# Patient Record
Sex: Female | Born: 1955 | Race: Black or African American | Hispanic: No | Marital: Married | Smoking: Never smoker
Health system: Southern US, Community
[De-identification: ages and names within clinical notes are randomized; demographics above are authoritative.]

## PROBLEM LIST (undated history)

## (undated) DIAGNOSIS — K219 Gastro-esophageal reflux disease without esophagitis: Secondary | ICD-10-CM

## (undated) DIAGNOSIS — I8390 Asymptomatic varicose veins of unspecified lower extremity: Secondary | ICD-10-CM

## (undated) DIAGNOSIS — T7840XA Allergy, unspecified, initial encounter: Secondary | ICD-10-CM

## (undated) DIAGNOSIS — F32A Depression, unspecified: Secondary | ICD-10-CM

## (undated) DIAGNOSIS — F329 Major depressive disorder, single episode, unspecified: Secondary | ICD-10-CM

## (undated) DIAGNOSIS — J302 Other seasonal allergic rhinitis: Secondary | ICD-10-CM

## (undated) HISTORY — PX: UPPER GI ENDOSCOPY: SHX6162

## (undated) HISTORY — DX: Other seasonal allergic rhinitis: J30.2

## (undated) HISTORY — DX: Asymptomatic varicose veins of unspecified lower extremity: I83.90

## (undated) HISTORY — PX: WISDOM TOOTH EXTRACTION: SHX21

## (undated) HISTORY — DX: Major depressive disorder, single episode, unspecified: F32.9

## (undated) HISTORY — DX: Depression, unspecified: F32.A

## (undated) HISTORY — DX: Allergy, unspecified, initial encounter: T78.40XA

## (undated) HISTORY — DX: Gastro-esophageal reflux disease without esophagitis: K21.9

---

## 2001-06-21 ENCOUNTER — Ambulatory Visit: Admission: RE | Admit: 2001-06-21 | Discharge: 2001-06-21 | Payer: Self-pay | Admitting: *Deleted

## 2001-06-21 ENCOUNTER — Encounter: Payer: Self-pay | Admitting: Family Medicine

## 2002-02-27 ENCOUNTER — Encounter: Payer: Self-pay | Admitting: Family Medicine

## 2002-02-27 ENCOUNTER — Encounter: Admission: RE | Admit: 2002-02-27 | Discharge: 2002-02-27 | Payer: Self-pay | Admitting: Family Medicine

## 2003-05-19 ENCOUNTER — Encounter: Payer: Self-pay | Admitting: Internal Medicine

## 2003-05-19 ENCOUNTER — Encounter: Admission: RE | Admit: 2003-05-19 | Discharge: 2003-05-19 | Payer: Self-pay | Admitting: Internal Medicine

## 2003-06-04 ENCOUNTER — Encounter: Admission: RE | Admit: 2003-06-04 | Discharge: 2003-06-04 | Payer: Self-pay | Admitting: Internal Medicine

## 2003-06-04 ENCOUNTER — Encounter: Payer: Self-pay | Admitting: Internal Medicine

## 2003-07-05 ENCOUNTER — Encounter: Admission: RE | Admit: 2003-07-05 | Discharge: 2003-07-05 | Payer: Self-pay | Admitting: Internal Medicine

## 2003-07-05 ENCOUNTER — Encounter: Payer: Self-pay | Admitting: Internal Medicine

## 2003-07-26 ENCOUNTER — Encounter: Payer: Self-pay | Admitting: Pain Medicine

## 2003-07-26 ENCOUNTER — Encounter: Admission: RE | Admit: 2003-07-26 | Discharge: 2003-07-26 | Payer: Self-pay | Admitting: Pain Medicine

## 2003-12-13 HISTORY — PX: BUNIONECTOMY: SHX129

## 2004-07-15 ENCOUNTER — Encounter: Admission: RE | Admit: 2004-07-15 | Discharge: 2004-07-15 | Payer: Self-pay | Admitting: Internal Medicine

## 2004-08-02 ENCOUNTER — Encounter: Admission: RE | Admit: 2004-08-02 | Discharge: 2004-08-02 | Payer: Self-pay | Admitting: Internal Medicine

## 2004-08-12 ENCOUNTER — Ambulatory Visit (HOSPITAL_COMMUNITY): Admission: RE | Admit: 2004-08-12 | Discharge: 2004-08-12 | Payer: Self-pay | Admitting: Family Medicine

## 2004-09-01 ENCOUNTER — Ambulatory Visit (HOSPITAL_COMMUNITY): Admission: RE | Admit: 2004-09-01 | Discharge: 2004-09-01 | Payer: Self-pay | Admitting: Family Medicine

## 2005-07-12 ENCOUNTER — Encounter: Admission: RE | Admit: 2005-07-12 | Discharge: 2005-07-12 | Payer: Self-pay | Admitting: Internal Medicine

## 2005-11-10 ENCOUNTER — Ambulatory Visit (HOSPITAL_COMMUNITY): Admission: RE | Admit: 2005-11-10 | Discharge: 2005-11-10 | Payer: Self-pay | Admitting: Orthopedic Surgery

## 2005-11-10 ENCOUNTER — Ambulatory Visit (HOSPITAL_BASED_OUTPATIENT_CLINIC_OR_DEPARTMENT_OTHER): Admission: RE | Admit: 2005-11-10 | Discharge: 2005-11-10 | Payer: Self-pay | Admitting: Orthopedic Surgery

## 2006-09-05 ENCOUNTER — Encounter: Admission: RE | Admit: 2006-09-05 | Discharge: 2006-09-05 | Payer: Self-pay | Admitting: Family Medicine

## 2008-01-10 ENCOUNTER — Encounter: Admission: RE | Admit: 2008-01-10 | Discharge: 2008-01-10 | Payer: Self-pay | Admitting: Family Medicine

## 2009-02-11 ENCOUNTER — Encounter: Payer: Self-pay | Admitting: Gastroenterology

## 2009-02-25 ENCOUNTER — Encounter: Payer: Self-pay | Admitting: Gastroenterology

## 2009-03-04 ENCOUNTER — Encounter: Admission: RE | Admit: 2009-03-04 | Discharge: 2009-03-04 | Payer: Self-pay | Admitting: Internal Medicine

## 2009-04-28 ENCOUNTER — Ambulatory Visit: Payer: Self-pay | Admitting: Gastroenterology

## 2009-04-28 DIAGNOSIS — D509 Iron deficiency anemia, unspecified: Secondary | ICD-10-CM

## 2009-04-28 DIAGNOSIS — K219 Gastro-esophageal reflux disease without esophagitis: Secondary | ICD-10-CM | POA: Insufficient documentation

## 2009-05-08 ENCOUNTER — Ambulatory Visit: Payer: Self-pay | Admitting: Gastroenterology

## 2009-05-08 LAB — CONVERTED CEMR LAB: Fecal Occult Bld: NEGATIVE

## 2010-04-28 ENCOUNTER — Encounter: Admission: RE | Admit: 2010-04-28 | Discharge: 2010-04-28 | Payer: Self-pay | Admitting: Internal Medicine

## 2011-04-29 NOTE — Op Note (Signed)
NAME:  Valerie Vega, Valerie Vega                 ACCOUNT NO.:  [id number]   MEDICAL RECORD NO.:  [id number]          PATIENT TYPE:  AMB   LOCATION:  [facility]                         FACILITY:  [facility]   PHYSICIAN:  Marlowe Kays, M.D.  DATE OF BIRTH:  June 13, 1956   DATE OF PROCEDURE:  11/10/2005  DATE OF DISCHARGE:                                 OPERATIVE REPORT   PREOPERATIVE DIAGNOSES:  Painful bunion, right foot.   POSTOPERATIVE DIAGNOSES:  Painful bunion, right foot.   OPERATION:  Simple bunionectomy, right foot.   SURGEON:  Marlowe Kays, MD.   ASSISTANT:  Nurse.   ANESTHESIA:  General.   PATHOLOGY AND JUSTIFICATION FOR PROCEDURE:  She had a painful bunion in her  right foot with a 10 degree first and second metatarsal angle and I felt  that either McBride or simple bunionectomy would suffice and she did have  some hallux valgus deformity as well.   DESCRIPTION OF PROCEDURE:  Satisfactory general anesthesia, pneumatic  tourniquet, right lower extremity was esmarched out sterilely and prepped  from mid calf to toes with DuraPrep, draped in a sterile field. A dorsal  medial incision was made protecting the overlying dorsal sensory nerve, the  capsule was identified and opened with a flap based distally. A large bunion  was noted. She also had some dorsal osteophytes which were fairly prominent  and a good bit of joint fluid from irritation. With a small rongeur, I  resected the spurs off the dorsum of the first metatarsal head with the  joint surfaces looking good and then used an osteotome and rongeur to remove  the large bunion in a smoothed off straight fashion. I then was able to  bring the great toe in a corrected position and with proximal traction on  the capsular flap, hold it there. I trimmed off a bit of the proximal  portion of the flap so as to remove redundant capsule. After irrigating the  wound well with sterile saline and injecting the wound with 0.5% plain  Marcaine, I  closed it. With a great toe in neutral, I placed multiple  capsular stitches of #0 Vicryl followed by multiple interrupted 4-0 nylon  mattress sutures in the skin and subcutaneous tissue. Betadine Adaptic dry  sterile dressing were applied, tourniquet was released and at the time of  this dictation she was on her way to the recovery room in satisfactory  condition with no known complications.           ______________________________  Marlowe Kays, M.D.     JA/MEDQ  D:  11/10/2005  T:  11/11/2005  Job:  [id number]

## 2011-08-05 ENCOUNTER — Other Ambulatory Visit: Payer: Self-pay | Admitting: Internal Medicine

## 2011-08-05 DIAGNOSIS — Z1231 Encounter for screening mammogram for malignant neoplasm of breast: Secondary | ICD-10-CM

## 2011-08-23 ENCOUNTER — Ambulatory Visit
Admission: RE | Admit: 2011-08-23 | Discharge: 2011-08-23 | Disposition: A | Discharge status: Home / Self Care | Source: Ambulatory Visit | Attending: Internal Medicine | Admitting: Internal Medicine

## 2011-08-23 DIAGNOSIS — Z1231 Encounter for screening mammogram for malignant neoplasm of breast: Secondary | ICD-10-CM

## 2012-11-30 ENCOUNTER — Other Ambulatory Visit: Payer: Self-pay | Admitting: Obstetrics & Gynecology

## 2012-11-30 DIAGNOSIS — Z1231 Encounter for screening mammogram for malignant neoplasm of breast: Secondary | ICD-10-CM

## 2012-12-24 ENCOUNTER — Ambulatory Visit
Admission: RE | Admit: 2012-12-24 | Discharge: 2012-12-24 | Disposition: A | Discharge status: Home / Self Care | Source: Ambulatory Visit | Attending: Obstetrics & Gynecology | Admitting: Obstetrics & Gynecology

## 2012-12-24 DIAGNOSIS — Z1231 Encounter for screening mammogram for malignant neoplasm of breast: Secondary | ICD-10-CM

## 2014-05-08 ENCOUNTER — Other Ambulatory Visit: Payer: Self-pay | Admitting: Internal Medicine

## 2014-05-08 DIAGNOSIS — Z1231 Encounter for screening mammogram for malignant neoplasm of breast: Secondary | ICD-10-CM

## 2014-05-12 ENCOUNTER — Encounter: Payer: Self-pay | Admitting: Obstetrics & Gynecology

## 2014-05-13 ENCOUNTER — Ambulatory Visit: Payer: Self-pay | Admitting: Obstetrics & Gynecology

## 2014-05-13 ENCOUNTER — Encounter: Payer: Self-pay | Admitting: Obstetrics & Gynecology

## 2014-05-19 ENCOUNTER — Telehealth: Payer: Self-pay | Admitting: Obstetrics & Gynecology

## 2014-05-19 NOTE — Telephone Encounter (Signed)
Patient called to reschedule a missed appointment [facility]) for her AEX on 05/13/14 with Dr. Hyacinth Meeker. She requested the missed appointment fee be waived due to her doctor's office, [facility], made the appointment and did not notify her of it. I called Dr. Vicente Males office and spoke with Misty Stanley to confirm they did schedule the appointment. Per Misty Stanley, they did schedule it for her and did notify her of the appointment on 05/08/14, the same day it was made. Please advise?

## 2014-05-20 ENCOUNTER — Ambulatory Visit

## 2014-05-21 ENCOUNTER — Ambulatory Visit
Admission: RE | Admit: 2014-05-21 | Discharge: 2014-05-21 | Disposition: A | Discharge status: Home / Self Care | Source: Ambulatory Visit | Attending: Internal Medicine | Admitting: Internal Medicine

## 2014-05-21 DIAGNOSIS — Z1231 Encounter for screening mammogram for malignant neoplasm of breast: Secondary | ICD-10-CM

## 2014-05-22 NOTE — Telephone Encounter (Signed)
DNKA FEE WAIVED THIS TIME AS MISCOMMUNICATION BETWEEN PTS PCP AND PT LEFT MESSAGE FOR PT TO RETURN MY CALL

## 2014-06-17 ENCOUNTER — Encounter: Payer: Self-pay | Admitting: Nurse Practitioner

## 2014-06-17 ENCOUNTER — Ambulatory Visit (INDEPENDENT_AMBULATORY_CARE_PROVIDER_SITE_OTHER): Admitting: Nurse Practitioner

## 2014-06-17 VITALS — BP 136/74 | HR 84 | Ht 67.5 in | Wt 165.0 lb

## 2014-06-17 DIAGNOSIS — Z01419 Encounter for gynecological examination (general) (routine) without abnormal findings: Secondary | ICD-10-CM

## 2014-06-17 NOTE — Patient Instructions (Signed)

## 2014-06-17 NOTE — Progress Notes (Signed)
Patient ID: Valerie Vega, female   DOB: 12/11/1956, 58 y.o.   MRN: [id number] 58 y.o. 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  Doing well on Vesicare from PCP.  PCP has given her another med to try but not as helpful.  Patient and husband travel a lot and enjoy each other all the time.  He will be going on part time work next month and she is only doing part time real estate.  They will have more time to travel.  Married this time for 13 years.  Patient's last menstrual period was 12/12/2008.          Sexually active: Yes.    The current method of family planning is post menopausal status.    Exercising: Yes.    Home exercise routine includes walking and gardening. Smoker:  no  Health Maintenance: Pap:  11/24/11, WNL, neg HR HPV MMG:  05/21/14, Bi-Rads 1: negative Colonoscopy:  2007, repeat in 10 years BMD:   05/2011[facility] [place] [facility] TDaP:  09/22/09 Labs: PCP   reports that she has never smoked. She has never used smokeless tobacco. She reports that she drinks alcohol. She reports that she does not use illicit drugs.  Past Medical History  Diagnosis Date  . GERD (gastroesophageal reflux disease)   . Seasonal allergies   . Anemia   . Varicose vein     Past Surgical History  Procedure Laterality Date  . Bunionectomy  2005    right foot    Current Outpatient Prescriptions  Medication Sig Dispense Refill  . ALFALFA PO Take by mouth daily.      . Calcium-Magnesium-Vitamin D (CALCIUM 500 PO) Take by mouth 2 (two) times daily.      . Cholecalciferol (VITAMIN D PO) Take by mouth daily.      . Loratadine-Pseudoephedrine (CLARITIN-D 24 HOUR PO) Take by mouth as needed.      . Multiple Vitamins-Minerals (MULTIVITAMIN PO) Take by mouth daily.      . Naproxen Sodium (ALEVE PO) Take by mouth as needed.      . NON FORMULARY Protein shake daily      . NON FORMULARY Menopause vitamin daily      . pantoprazole (PROTONIX) 20 MG tablet Take 20 mg  by mouth daily.      . solifenacin (VESICARE) 5 MG tablet Take 5 mg by mouth daily.      . Cyanocobalamin (VITAMIN B-12 PO) Take by mouth daily.       No current facility-administered medications for this visit.    Family History  Problem Relation Age of Onset  . Diabetes Maternal Aunt   . Leukemia Brother   . Lung cancer Father 3475    smoker  . Hypertension Mother   . Osteoporosis Mother   . Hypertension Sister   . Varicose Veins Sister     ROS:  Pertinent items are noted in HPI.  Otherwise, a comprehensive ROS was negative.  Exam:   BP 136/74  Pulse 84  Ht 5' 7.5" (1.715 m)  Wt 165 lb (74.844 kg)  BMI 25.45 kg/m2  LMP 12/12/2008 Height: 5' 7.5" (171.5 cm)  Ht Readings from Last 3 Encounters:  06/17/14 5' 7.5" (1.715 m)  04/28/09 5\' 7"  (1.702 m)    General appearance: alert, cooperative and appears stated age Head: Normocephalic, without obvious abnormality, atraumatic Neck: no adenopathy, supple, symmetrical, trachea midline and thyroid normal to inspection and palpation Lungs: clear to  auscultation bilaterally Breasts: normal appearance, no masses or tenderness Heart: regular rate and rhythm Abdomen: soft, non-tender; no masses,  no organomegaly Extremities: extremities normal, atraumatic, no cyanosis or edema Skin: Skin color, texture, turgor normal. No rashes or lesions Lymph nodes: Cervical, supraclavicular, and axillary nodes normal. No abnormal inguinal nodes palpated Neurologic: Grossly normal   Pelvic: External genitalia:  no lesions              Urethra:  normal appearing urethra with no masses, tenderness or lesions              Bartholin's and Skene's: normal                 Vagina: normal appearing vagina with normal color and discharge, no lesions              Cervix: anteverted              Pap taken: No. Bimanual Exam:  Uterus:  normal size, contour, position, consistency, mobility, non-tender              Adnexa: no mass, fullness, tenderness                Rectovaginal: Confirms               Anus:  normal sphincter tone, no lesions  A:  Well Woman with normal exam  Postmenopausal  History of urge incontinence doing well on Vesicare  P:   Reviewed health and wellness pertinent to exam  Pap smear not taken today  Mammogram is due 05/2015  PCP gives Vesicare  Counseled on breast self exam, mammography screening, adequate intake of calcium and vitamin D, diet and exercise, Kegel's exercises return annually or prn  An After Visit Summary was printed and given to the patient.

## 2014-06-18 NOTE — Progress Notes (Signed)
Encounter reviewed by Dr. Brook Silva.  

## 2014-10-13 ENCOUNTER — Encounter: Payer: Self-pay | Admitting: Nurse Practitioner

## 2015-12-23 ENCOUNTER — Other Ambulatory Visit: Payer: Self-pay

## 2015-12-23 DIAGNOSIS — Z1231 Encounter for screening mammogram for malignant neoplasm of breast: Secondary | ICD-10-CM

## 2016-01-08 ENCOUNTER — Ambulatory Visit
Admission: RE | Admit: 2016-01-08 | Discharge: 2016-01-08 | Disposition: A | Discharge status: Home / Self Care | Source: Ambulatory Visit

## 2016-01-08 DIAGNOSIS — Z1231 Encounter for screening mammogram for malignant neoplasm of breast: Secondary | ICD-10-CM

## 2017-09-26 ENCOUNTER — Other Ambulatory Visit: Payer: Self-pay | Admitting: Internal Medicine

## 2017-09-26 DIAGNOSIS — Z1231 Encounter for screening mammogram for malignant neoplasm of breast: Secondary | ICD-10-CM

## 2017-10-03 ENCOUNTER — Ambulatory Visit
Admission: RE | Admit: 2017-10-03 | Discharge: 2017-10-03 | Disposition: A | Discharge status: Home / Self Care | Source: Ambulatory Visit | Attending: Internal Medicine | Admitting: Internal Medicine

## 2017-10-03 DIAGNOSIS — Z1231 Encounter for screening mammogram for malignant neoplasm of breast: Secondary | ICD-10-CM

## 2017-10-24 ENCOUNTER — Encounter: Payer: Self-pay | Admitting: Internal Medicine

## 2017-11-08 ENCOUNTER — Encounter: Payer: Self-pay | Admitting: Gastroenterology

## 2017-12-21 ENCOUNTER — Ambulatory Visit (AMBULATORY_SURGERY_CENTER): Payer: Self-pay | Admitting: *Deleted

## 2017-12-21 ENCOUNTER — Other Ambulatory Visit: Payer: Self-pay

## 2017-12-21 VITALS — Ht 66.0 in | Wt 178.4 lb

## 2017-12-21 DIAGNOSIS — Z1211 Encounter for screening for malignant neoplasm of colon: Secondary | ICD-10-CM

## 2017-12-21 MED ORDER — SUPREP BOWEL PREP KIT 17.5-3.13-1.6 GM/177ML PO SOLN: 1.0000 | Freq: Once | ORAL | 0 refills | Status: AC

## 2017-12-21 NOTE — Progress Notes (Signed)
Patient denies any allergies to egg or soy products. Patient denies complications with anesthesia/sedation.  Patient denies oxygen use at home and denies diet medications. Pamphlet on colonoscopy given to patient. 

## 2018-01-04 ENCOUNTER — Encounter: Admitting: Gastroenterology

## 2018-01-09 ENCOUNTER — Telehealth: Payer: Self-pay | Admitting: Gastroenterology

## 2018-01-09 NOTE — Telephone Encounter (Signed)
Understood. Reschedule OK  - HD

## 2018-01-10 ENCOUNTER — Encounter: Admitting: Gastroenterology

## 2018-01-18 ENCOUNTER — Other Ambulatory Visit: Payer: Self-pay

## 2018-01-18 ENCOUNTER — Encounter: Payer: Self-pay | Admitting: Gastroenterology

## 2018-01-18 ENCOUNTER — Ambulatory Visit (AMBULATORY_SURGERY_CENTER): Admitting: Gastroenterology

## 2018-01-18 VITALS — BP 142/82 | HR 78 | Temp 98.6°F | Resp 13 | Ht 66.0 in | Wt 178.0 lb

## 2018-01-18 DIAGNOSIS — Z1211 Encounter for screening for malignant neoplasm of colon: Secondary | ICD-10-CM

## 2018-01-18 DIAGNOSIS — Z1212 Encounter for screening for malignant neoplasm of rectum: Secondary | ICD-10-CM

## 2018-01-18 MED ORDER — SODIUM CHLORIDE 0.9 % IV SOLN: 500.0000 mL | Freq: Once | INTRAVENOUS | Status: AC

## 2018-01-18 NOTE — Op Note (Signed)
[facility] Patient Name: Valerie Vega Procedure Date: 01/18/2018 10:02 AM MRN: [id number] Endoscopist: Sherilyn CooterHenry L. Myrtie Neitheranis , MD Age: [age] Referring MD:  Date of Birth: 08/27/1956 Gender: Female Account #: [id number] Procedure:                Colonoscopy Indications:              Screening for colorectal malignant neoplasm                            (patient reported a normal colonoscopy > 10 years                            prior) Medicines:                Monitored Anesthesia Care Procedure:                Pre-Anesthesia Assessment:                           - Prior to the procedure, a History and Physical                            was performed, and patient medications and                            allergies were reviewed. The patient's tolerance of                            previous anesthesia was also reviewed. The risks                            and benefits of the procedure and the sedation                            options and risks were discussed with the patient.                            All questions were answered, and informed consent                            was obtained. Prior Anticoagulants: The patient has                            taken no previous anticoagulant or antiplatelet                            agents. ASA Grade Assessment: II - A patient with                            mild systemic disease. After reviewing the risks                            and benefits, the patient was deemed in  satisfactory condition to undergo the procedure.                           After obtaining informed consent, the colonoscope                            was passed under direct vision. Throughout the                            procedure, the patient's blood pressure, pulse, and                            oxygen saturations were monitored continuously. The                            Colonoscope was introduced through the anus and                 advanced to the the cecum, identified by                            appendiceal orifice and ileocecal valve. The                            colonoscopy was performed without difficulty. The                            patient tolerated the procedure well. The quality                            of the bowel preparation was good. The ileocecal                            valve, appendiceal orifice, and rectum were                            photographed. The quality of the bowel preparation                            was evaluated using the BBPS [facility] Bowel                            Preparation Scale) with scores of: Right Colon = 2,                            Transverse Colon = 2 and Left Colon = 2. The total                            BBPS score equals 6. Scope In: 10:19:37 AM Scope Out: 10:36:29 AM Scope Withdrawal Time: 0 hours 12 minutes 23 seconds  Total Procedure Duration: 0 hours 16 minutes 52 seconds  Findings:                 The perianal and digital rectal examinations were  normal.                           The entire examined colon appeared normal on direct                            and retroflexion views. Complications:            No immediate complications. Estimated Blood Loss:     Estimated blood loss: none. Impression:               - The entire examined colon is normal on direct and                            retroflexion views.                           - No specimens collected. Recommendation:           - Patient has a contact number available for                            emergencies. The signs and symptoms of potential                            delayed complications were discussed with the                            patient. Return to normal activities tomorrow.                            Written discharge instructions were provided to the                            patient.                           - Resume previous  diet.                           - Continue present medications.                           - Repeat colonoscopy in 10 years for screening                            purposes. Henry L. Myrtie Neither, MD 01/18/2018 10:39:49 AM This report has been signed electronically.

## 2018-01-18 NOTE — Patient Instructions (Signed)
YOU HAD AN ENDOSCOPIC PROCEDURE TODAY AT THE [facility]:   Refer to the procedure report that was given to you for any specific questions about what was found during the examination.  If the procedure report does not answer your questions, please call your gastroenterologist to clarify.  If you requested that your care partner not be given the details of your procedure findings, then the procedure report has been included in a sealed envelope for you to review at your convenience later.  YOU SHOULD EXPECT: Some feelings of bloating in the abdomen. Passage of more gas than usual.  Walking can help get rid of the air that was put into your GI tract during the procedure and reduce the bloating. If you had a lower endoscopy (such as a colonoscopy or flexible sigmoidoscopy) you may notice spotting of blood in your stool or on the toilet paper. If you underwent a bowel prep for your procedure, you may not have a normal bowel movement for a few days.  Please Note:  You might notice some irritation and congestion in your nose or some drainage.  This is from the oxygen used during your procedure.  There is no need for concern and it should clear up in a day or so.  SYMPTOMS TO REPORT IMMEDIATELY:   Following lower endoscopy (colonoscopy or flexible sigmoidoscopy):  Excessive amounts of blood in the stool  Significant tenderness or worsening of abdominal pains  Swelling of the abdomen that is new, acute  Fever of 100F or higher   For urgent or emergent issues, a gastroenterologist can be reached at any hour by calling [phone].   DIET:  We do recommend a small meal at first, but then you may proceed to your regular diet.  Drink plenty of fluids but you should avoid alcoholic beverages for 24 hours.  ACTIVITY:  You should plan to take it easy for the rest of today and you should NOT DRIVE or use heavy machinery until tomorrow (because of the sedation medicines used during the test).     FOLLOW UP: Our staff will call the number listed on your records the next business day following your procedure to check on you and address any questions or concerns that you may have regarding the information given to you following your procedure. If we do not reach you, we will leave a message.  However, if you are feeling well and you are not experiencing any problems, there is no need to return our call.  We will assume that you have returned to your regular daily activities without incident.  If any biopsies were taken you will be contacted by phone or by letter within the next 1-3 weeks.  Please call us at [phone] if you have not heard about the biopsies in 3 weeks.    SIGNATURES/CONFIDENTIALITY: You and/or your care partner have signed paperwork which will be entered into your electronic medical record.  These signatures attest to the fact that that the information above on your After Visit Summary has been reviewed and is understood.  Full responsibility of the confidentiality of this discharge information lies with you and/or your care-partner.  Recall colonoscopy is in 10 years.  We will send you a leytter.

## 2018-01-18 NOTE — Progress Notes (Signed)
Report given to PACU, vss 

## 2018-01-19 ENCOUNTER — Telehealth: Payer: Self-pay

## 2018-01-19 NOTE — Telephone Encounter (Signed)
  Follow up Call-  Call back number 01/18/2018  Post procedure Call Back phone  # [phone]  Permission to leave phone message Yes  Some recent data might be hidden     Patient questions:  Do you have a fever, pain , or abdominal swelling? No. Pain Score  0 *  Have you tolerated food without any problems? Yes.    Have you been able to return to your normal activities? Yes.    Do you have any questions about your discharge instructions: Diet   No. Medications  No. Follow up visit  No.  Do you have questions or concerns about your Care? No.  Actions: * If pain score is 4 or above: No action needed, pain <4.

## 2018-11-16 ENCOUNTER — Other Ambulatory Visit: Payer: Self-pay | Admitting: Internal Medicine

## 2018-11-16 DIAGNOSIS — Z1231 Encounter for screening mammogram for malignant neoplasm of breast: Secondary | ICD-10-CM

## 2018-12-25 ENCOUNTER — Ambulatory Visit
Admission: RE | Admit: 2018-12-25 | Discharge: 2018-12-25 | Disposition: A | Discharge status: Home / Self Care | Source: Ambulatory Visit | Attending: Internal Medicine | Admitting: Internal Medicine

## 2018-12-25 DIAGNOSIS — Z1231 Encounter for screening mammogram for malignant neoplasm of breast: Secondary | ICD-10-CM

## 2020-11-03 ENCOUNTER — Other Ambulatory Visit: Payer: Self-pay | Admitting: Internal Medicine

## 2020-11-03 DIAGNOSIS — Z1231 Encounter for screening mammogram for malignant neoplasm of breast: Secondary | ICD-10-CM

## 2021-11-24 ENCOUNTER — Other Ambulatory Visit: Payer: Self-pay | Admitting: Internal Medicine

## 2021-11-24 DIAGNOSIS — Z0000 Encounter for general adult medical examination without abnormal findings: Secondary | ICD-10-CM

## 2022-01-19 ENCOUNTER — Ambulatory Visit
Admission: RE | Admit: 2022-01-19 | Discharge: 2022-01-19 | Disposition: A | Discharge status: Home / Self Care | Payer: Medicare Other | Source: Ambulatory Visit | Attending: Internal Medicine | Admitting: Internal Medicine

## 2022-01-19 DIAGNOSIS — Z0000 Encounter for general adult medical examination without abnormal findings: Secondary | ICD-10-CM

## 2023-01-02 ENCOUNTER — Other Ambulatory Visit: Payer: Self-pay | Admitting: Internal Medicine

## 2023-01-02 DIAGNOSIS — Z1231 Encounter for screening mammogram for malignant neoplasm of breast: Secondary | ICD-10-CM

## 2023-02-22 ENCOUNTER — Ambulatory Visit
Admission: RE | Admit: 2023-02-22 | Discharge: 2023-02-22 | Disposition: A | Discharge status: Home / Self Care | Payer: Medicare Other | Source: Ambulatory Visit | Attending: Internal Medicine | Admitting: Internal Medicine

## 2023-02-22 DIAGNOSIS — Z1231 Encounter for screening mammogram for malignant neoplasm of breast: Secondary | ICD-10-CM

## 2023-08-07 IMAGING — MG MM DIGITAL SCREENING BILAT W/ TOMO AND CAD
8 series · 8 of 24 positions shown · non-contrast
Comparison: Previous exam(s).

ACR Breast Density Category a: The breast tissue is almost entirely
fatty.

CLINICAL DATA: Screening.

EXAM:
DIGITAL SCREENING BILATERAL MAMMOGRAM WITH TOMOSYNTHESIS AND CAD
TECHNIQUE: Bilateral screening digital craniocaudal and mediolateral oblique
mammograms were obtained. Bilateral screening digital breast
tomosynthesis was performed. The images were evaluated with
computer-aided detection.

[R MLO synth-2D]
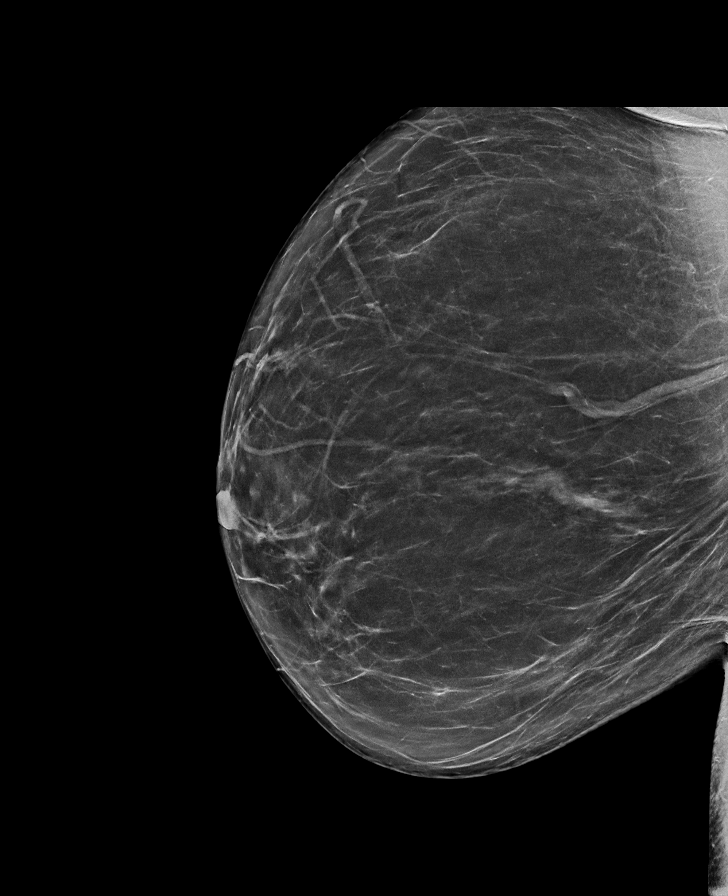

[L MLO synth-2D]
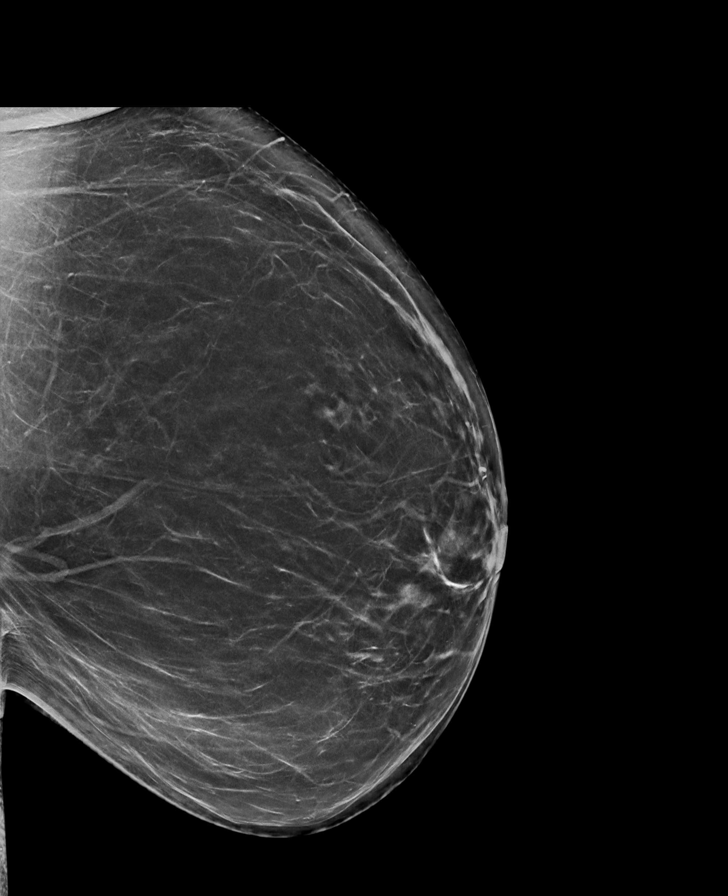

[L CC synth-2D]
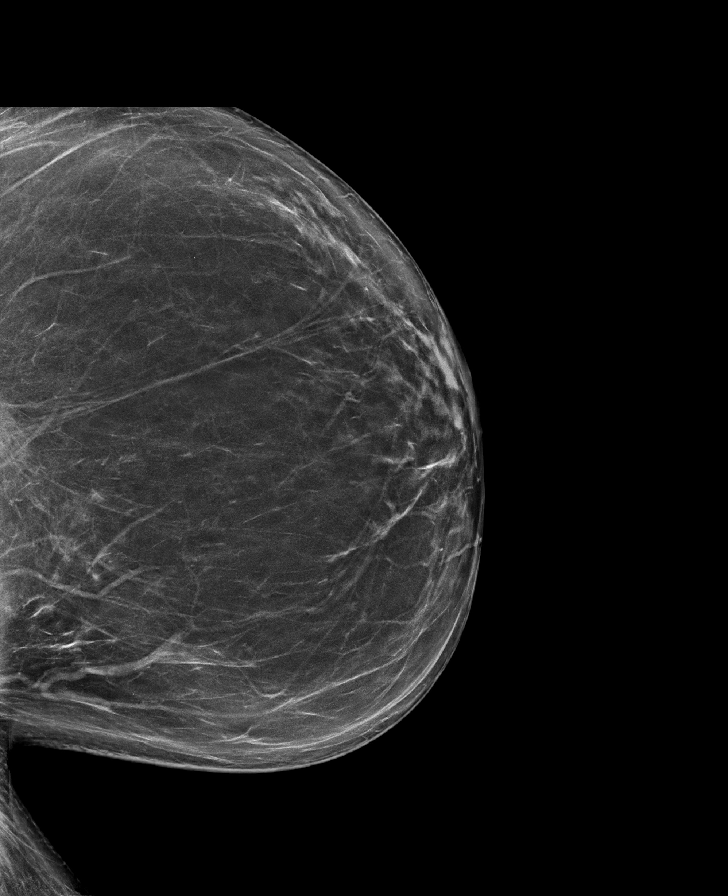

[R CC synth-2D]
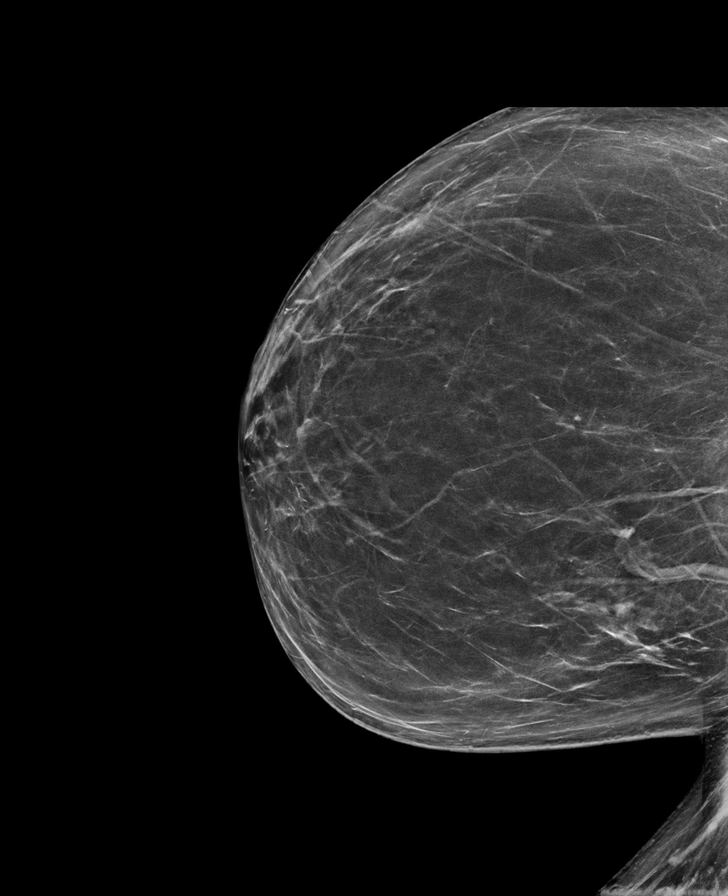

[R MLO tomo · tomo slice 43/85.0]
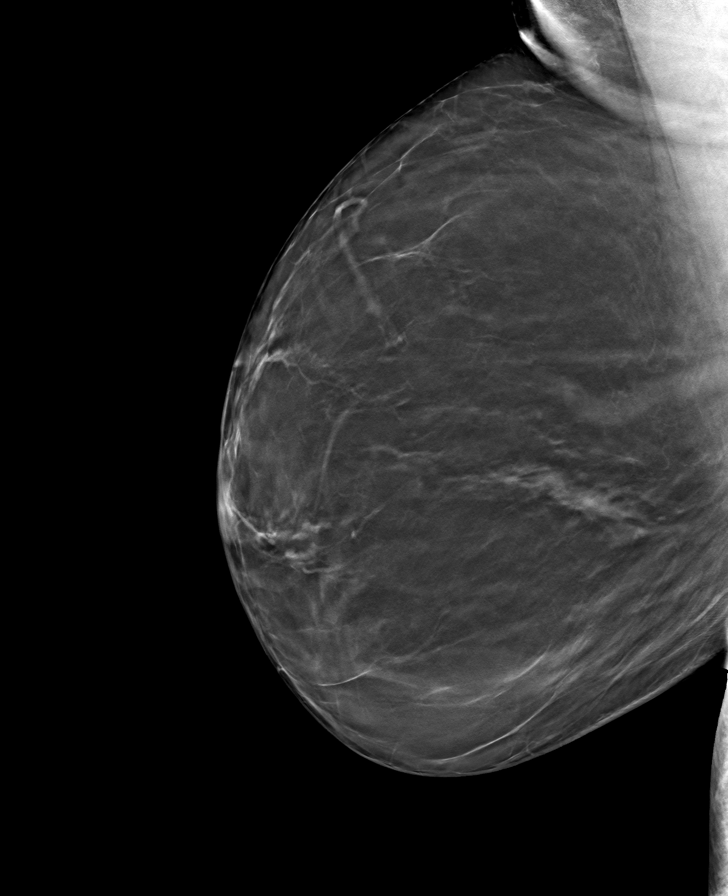

[L MLO tomo · tomo slice 45/88.0]
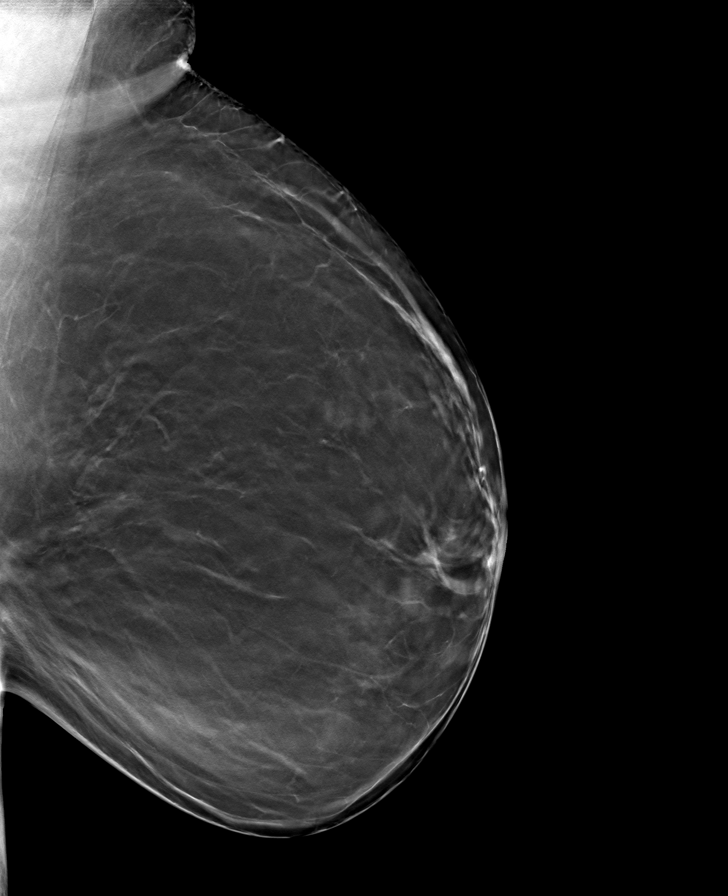

[L CC tomo · tomo slice 42/83.0]
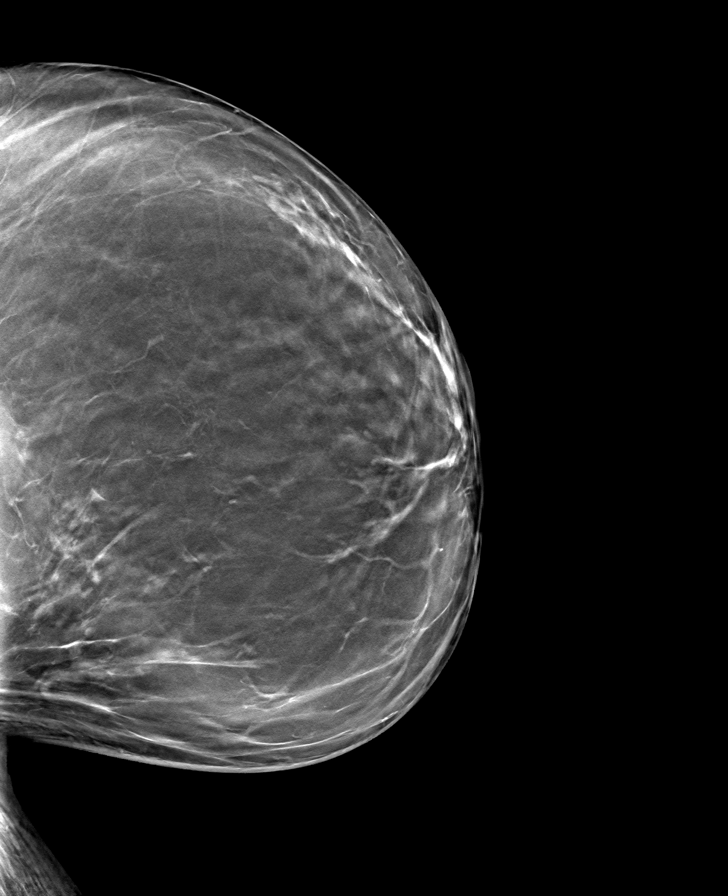

[R CC tomo · tomo slice 40/79.0]
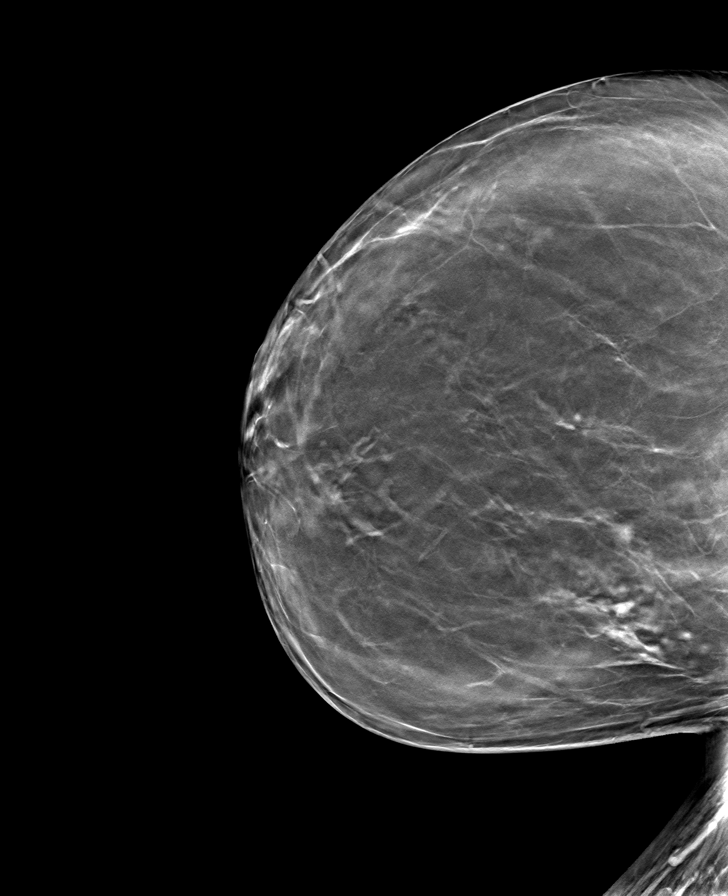

[8 of 24 positions shown; findings below may reference images not displayed]

FINDINGS: There are no findings suspicious for malignancy.
IMPRESSION: No mammographic evidence of malignancy. A result letter of this
screening mammogram will be mailed directly to the patient.

RECOMMENDATION:
Screening mammogram in one year. (Code:0E-3-N98)

BI-RADS CATEGORY  1: Negative.

## 2024-04-02 ENCOUNTER — Other Ambulatory Visit: Payer: Self-pay | Admitting: Internal Medicine

## 2024-04-02 DIAGNOSIS — Z1231 Encounter for screening mammogram for malignant neoplasm of breast: Secondary | ICD-10-CM

## 2024-04-03 ENCOUNTER — Ambulatory Visit
Admission: RE | Admit: 2024-04-03 | Discharge: 2024-04-03 | Disposition: A | Discharge status: Home / Self Care | Source: Ambulatory Visit | Attending: Internal Medicine | Admitting: Internal Medicine

## 2024-04-03 DIAGNOSIS — Z1231 Encounter for screening mammogram for malignant neoplasm of breast: Secondary | ICD-10-CM
